# Patient Record
Sex: Female | Born: 1990
Health system: Southern US, Community
[De-identification: ages and names within clinical notes are randomized; demographics above are authoritative.]

## PROBLEM LIST (undated history)

## (undated) DIAGNOSIS — R87612 Low grade squamous intraepithelial lesion on cytologic smear of cervix (LGSIL): Secondary | ICD-10-CM

## (undated) HISTORY — DX: Low grade squamous intraepithelial lesion on cytologic smear of cervix (LGSIL): R87.612

---

## 2016-09-10 ENCOUNTER — Ambulatory Visit (INDEPENDENT_AMBULATORY_CARE_PROVIDER_SITE_OTHER): Payer: No Typology Code available for payment source | Admitting: Family Medicine

## 2016-09-10 VITALS — BP 120/88 | HR 81 | Temp 98.3°F | Resp 16 | Ht 68.5 in | Wt 135.8 lb

## 2016-09-10 DIAGNOSIS — R51 Headache: Secondary | ICD-10-CM

## 2016-09-10 DIAGNOSIS — H5712 Ocular pain, left eye: Secondary | ICD-10-CM | POA: Diagnosis not present

## 2016-09-10 DIAGNOSIS — R519 Headache, unspecified: Secondary | ICD-10-CM

## 2016-09-10 LAB — CBC
HCT: 41.8 % (ref 35.0–45.0)
Hemoglobin: 14 g/dL (ref 11.7–15.5)
MCH: 31.1 pg (ref 27.0–33.0)
MCHC: 33.5 g/dL (ref 32.0–36.0)
MCV: 92.9 fL (ref 80.0–100.0)
MPV: 9.3 fL (ref 7.5–12.5)
PLATELETS: 291 10*3/uL (ref 140–400)
RBC: 4.5 MIL/uL (ref 3.80–5.10)
RDW: 12.8 % (ref 11.0–15.0)
WBC: 4.9 10*3/uL (ref 3.8–10.8)

## 2016-09-10 LAB — THYROID PANEL WITH TSH
FREE THYROXINE INDEX: 2.8 (ref 1.4–3.8)
T3 UPTAKE: 29 % (ref 22–35)
T4, Total: 9.6 ug/dL (ref 4.5–12.0)
TSH: 1.43 mIU/L

## 2016-09-10 LAB — COMPREHENSIVE METABOLIC PANEL
ALBUMIN: 4.3 g/dL (ref 3.6–5.1)
ALT: 22 U/L (ref 6–29)
AST: 24 U/L (ref 10–30)
Alkaline Phosphatase: 47 U/L (ref 33–115)
BUN: 12 mg/dL (ref 7–25)
CALCIUM: 9.6 mg/dL (ref 8.6–10.2)
CHLORIDE: 103 mmol/L (ref 98–110)
CO2: 26 mmol/L (ref 20–31)
CREATININE: 0.79 mg/dL (ref 0.50–1.10)
Glucose, Bld: 86 mg/dL (ref 65–99)
Potassium: 4.4 mmol/L (ref 3.5–5.3)
SODIUM: 139 mmol/L (ref 135–146)
Total Bilirubin: 0.5 mg/dL (ref 0.2–1.2)
Total Protein: 7.1 g/dL (ref 6.1–8.1)

## 2016-09-10 LAB — FERRITIN: FERRITIN: 13 ng/mL (ref 10–154)

## 2016-09-10 NOTE — Progress Notes (Signed)
By signing my name below I, Shelah Lewandowsky, attest that this documentation has been prepared under the direction and in the presence of Norberto Sorenson, MD. Electonically Signed. Shelah Lewandowsky, Scribe 09/10/2016 at 12:04 PM  Subjective:    Patient ID: Jacqueline Hood, female    DOB: 1991/11/01, 25 y.o.   MRN: [id number]  Chief Complaint  Patient presents with  . Headache    behind Lt Eye off & on for 1 year    HPI Jacqueline Hood is a 25 y.o. female who presents to the [facility] complaining of HAs. Pt has had intermittent HAs behind left eye. Pt denies any eye waterying or runny nose during episodes. Pt denies photophobia during HAs. Pt states she has always had some trouble with bright lights but has never had any pain or photophobia.   2 weeks ago pt reports sudden onset of pressure under left eye, felt pre-syncopal during episode, resolved spontaneously. Had had second episode of similar pain. Pt also had HA during that time. Pt "did not feel right for 2 days afterwards". Pt thinks she may have had left hand numbness during that time and is unsure if it was real or imagined because she was anxious about her symptoms.   Pt reports that her balance has been fine. Denies any vertigo, numbness, changes in there hearing, urinary symptoms, abd pain, changes in her BMs. Pt also c/o swelling along her upper anterior neck. Pt has always seen "floaters" in her eye.   Takes spironolactone for acne, noticed she was having more HAs when she was taking 75mg  spironolactone, so she went back down to 50mg  spironolactone.   Pt's mother's sister has migraines. No other family history of HAs.   Pt reports first visit with opthalmologist 2 years ago. Pt seen by her eye doctor within the past week.   Pt denies any sinus issues or allergies.  Pt has distant history of anemia 10 years ago and has never had it rechecked.   Pt reports having normal menstrual periods.   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  There are no active problems to display for this patient.   No current outpatient prescriptions on file prior to visit.   No current facility-administered medications on file prior to visit.     Allergies  Allergen Reactions  . Levaquin [Levofloxacin In D5w] Shortness Of Breath    Depression screen [facility] 2/9 09/10/2016  Decreased Interest 0  Down, Depressed, Hopeless 0  PHQ - 2 Score 0       Review of Systems  Constitutional: Negative for fatigue and fever.  HENT: Negative for congestion, ear pain, hearing loss, rhinorrhea and sinus pressure.   Eyes: Negative for pain and visual disturbance.  Respiratory: Negative.   Cardiovascular: Negative.   Gastrointestinal: Negative.   Genitourinary: Negative.   Musculoskeletal: Negative.   Skin: Negative.   Neurological: Positive for headaches (intermittent). Negative for numbness.  Psychiatric/Behavioral: Negative.        Objective:   Physical Exam  Constitutional: She is oriented to person, place, and time. She appears well-developed and well-nourished. No distress.  HENT:  Head: Normocephalic and atraumatic.  Eyes: Conjunctivae and EOM are normal. Pupils are equal, round, and reactive to light.  Normal fundoscopic exam bilat.  Neck: Neck supple.  Cardiovascular: Normal rate, regular rhythm, S1 normal, S2 normal and normal heart sounds.  Exam reveals no gallop and no friction rub.   No murmur heard. Pulmonary/Chest: Effort normal and breath sounds normal. No accessory muscle usage. She has no decreased breath sounds. She has no wheezes. She has no rhonchi. She has no rales.  Musculoskeletal: Normal range of motion.  Neurological: She is alert and oriented to person, place, and time. She has  normal strength. No cranial nerve deficit or sensory deficit. She displays a negative Romberg sign.  Normal rapid alternating movement. Normal finger to nose. Normal heel to shin test.   Skin: Skin is warm and dry.  Psychiatric: She has a normal mood and affect. Her behavior is normal.  Nursing note and vitals reviewed. 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 Pt may need a referral for MRI because pt would like to go to where ever can provide the cheapest brain MRI. 1. Left-sided headache   2. Orbital pain, left   Very atypical but I am concerned as onset 1 yr ago now with worsening frequency and intensity. 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 I think we do need to proceed with imaging and neuro referral.  Orders Placed This Encounter  Procedures  . MR Brain Wo Contrast    Pt has multiple insurances and will bring copy of both but thinks that it might be cost-efficient for her to have the MRI done through the [facility] system where [place] out [place] - please call pt to check where she wants it and ensure we have the correct insurance filed prior to scheduling. Thanks.    Standing Status:   Future    Standing Expiration Date:   11/10/2017    Order Specific Question:   Reason for Exam (SYMPTOM  OR DIAGNOSIS REQUIRED)    Answer:   worsening left orbital headaches, new x 7175yr radiating to occiput, thunderclap type, enlarged  left optic disk    Order Specific Question:   Preferred imaging location?    Answer:   External    Order Specific Question:   What is the patient's sedation requirement?    Answer:   No Sedation    Order Specific Question:   Does the patient have a pacemaker or implanted devices?    Answer:   No  . CBC  . Sedimentation Rate  . C-reactive protein  . Comprehensive metabolic panel  . Thyroid Panel With TSH  . Ferritin  . Care order/instruction:    AVS and GO    Scheduling Instructions:     AVS and GO after labs drawn    Meds ordered this encounter  Medications  . spironolactone (ALDACTONE)  50 MG tablet    Sig: Take 50 mg by mouth daily.    I personally performed the services described in this documentation, which was scribed in my presence. The recorded information has been reviewed and considered, and addended by me as needed.   Norberto Sorenson, M.D.  [facility] [address] [place], [place] [address] [phone] phone [phone] fax  09/12/16 2:55 PM

## 2016-09-10 NOTE — Patient Instructions (Signed)
     IF you received an x-ray today, you will receive an invoice from [facility]. Please contact [facility] at [phone] with questions or concerns regarding your invoice.   IF you received labwork today, you will receive an invoice from [facility]. Please contact [facility] at [phone] with questions or concerns regarding your invoice.   Our billing staff will not be able to assist you with questions regarding bills from these companies.  You will be contacted with the lab results as soon as they are available. The fastest way to get your results is to activate your My Chart account. Instructions are located on the last page of this paperwork. If you have not heard from us regarding the results in 2 weeks, please contact this office.      

## 2016-09-11 LAB — SEDIMENTATION RATE: SED RATE: 1 mm/h (ref 0–20)

## 2016-09-12 LAB — C-REACTIVE PROTEIN: CRP: 2.7 mg/L (ref <8.0)

## 2016-09-21 ENCOUNTER — Telehealth: Payer: Self-pay | Admitting: *Deleted

## 2016-09-21 ENCOUNTER — Encounter: Payer: Self-pay | Admitting: Family Medicine

## 2016-09-21 NOTE — Telephone Encounter (Signed)
Patient would like to know lab results from 09/10/16.  [place]  You can just leave vm. [phone]

## 2016-09-21 NOTE — Telephone Encounter (Signed)
Everything looks great. Iron is on the low end of normal so try to increase iron in diet or start an iron supplement for several months.  Letter sent with details on iron.

## 2016-09-23 NOTE — Telephone Encounter (Signed)
Patient mailbox is full could not leave message

## 2016-09-27 NOTE — Telephone Encounter (Signed)
Patient mailbox is full could not leave message

## 2016-10-03 NOTE — Telephone Encounter (Signed)
Letter mailed to patient with lab results also included a pdf of iron enriched foods and daily amount per age

## 2016-10-03 NOTE — Telephone Encounter (Signed)
Unable to leave message due to mailbox being full.

## 2016-12-07 MED FILL — BENZONATATE 100 MG CAPSULE: 100 | 5 days supply | Qty: 30 | Fill #0

## 2016-12-07 MED FILL — METHYLPREDNISOLONE 4 MG TAB: 4 | 6 days supply | Qty: 21 | Fill #0

## 2017-10-20 ENCOUNTER — Other Ambulatory Visit: Payer: Self-pay | Admitting: Occupational Medicine

## 2017-10-20 ENCOUNTER — Ambulatory Visit: Payer: Self-pay

## 2017-10-20 DIAGNOSIS — M545 Low back pain: Secondary | ICD-10-CM

## 2018-01-17 ENCOUNTER — Encounter: Payer: Self-pay | Admitting: Nurse Practitioner

## 2018-01-17 ENCOUNTER — Ambulatory Visit: Payer: Self-pay | Admitting: Nurse Practitioner

## 2018-01-17 VITALS — BP 115/80 | HR 105 | Temp 98.6°F | Resp 16 | Wt 132.8 lb

## 2018-01-17 DIAGNOSIS — J029 Acute pharyngitis, unspecified: Secondary | ICD-10-CM

## 2018-01-17 DIAGNOSIS — J020 Streptococcal pharyngitis: Secondary | ICD-10-CM

## 2018-01-17 LAB — POCT RAPID STREP A (OFFICE): Rapid Strep A Screen: POSITIVE — AB

## 2018-01-17 MED ORDER — AMOXICILLIN-POT CLAVULANATE 875-125 MG PO TABS: 1.0000 | ORAL_TABLET | Freq: Two times a day (BID) | ORAL | 0 refills | Status: DC

## 2018-01-17 MED ORDER — FLUCONAZOLE 150 MG PO TABS: 150.0000 mg | ORAL_TABLET | Freq: Once | ORAL | 0 refills | Status: AC

## 2018-01-17 NOTE — Progress Notes (Signed)
   Subjective:    Patient ID: Selena BattenCharlotte Moravek, female    DOB: 1991-11-30, 27 y.o.   MRN: [id number]  HPI Patient comes in today c/o sore throat that started yesterday. She was diagnosed with strep at the end of last month and took 10 day course of keflex. Got better then sore throat started again yesterday. Low grade fever- taking motrin and tylenol.    Review of Systems  Constitutional: Positive for fever.  HENT: Positive for sore throat and trouble swallowing.   Respiratory: Negative.   Cardiovascular: Negative.   Gastrointestinal: Negative.   Genitourinary: Negative.   Neurological: Positive for headaches.  Psychiatric/Behavioral: Negative.   All other systems reviewed and are negative.      Objective:   Physical Exam  Constitutional: She is oriented to person, place, and time. She appears well-developed and well-nourished. She appears distressed (mild).  HENT:  Right Ear: Hearing, tympanic membrane, external ear and ear canal normal.  Left Ear: Hearing, tympanic membrane, external ear and ear canal normal.  Nose: Nose normal. Right sinus exhibits no maxillary sinus tenderness and no frontal sinus tenderness. Left sinus exhibits no maxillary sinus tenderness and no frontal sinus tenderness.  Mouth/Throat: Uvula is midline. Posterior oropharyngeal edema (2+ left  tonsil) and posterior oropharyngeal erythema present.  Neck: Normal range of motion.  Cardiovascular: Normal rate and regular rhythm.  Pulmonary/Chest: Effort normal and breath sounds normal.  Lymphadenopathy:    She has cervical adenopathy (left anterior cervical).  Neurological: She is alert and oriented to person, place, and time.  Skin: Skin is warm.  Psychiatric: She has a normal mood and affect. Her behavior is normal. Judgment and thought content normal.    BP 115/80 (BP Location: Right Arm, Patient Position: Sitting, Cuff Size: Normal)   Pulse (!) 105   Temp 98.6 F (37 C) (Oral)   Resp 16   Wt 132 lb  12.8 oz (60.2 kg)   SpO2 98%   BMI 19.90 kg/m   Strep positive       Assessment & Plan:   1. Sore throat   2. Strep pharyngitis    Meds ordered this encounter  Medications  . amoxicillin-clavulanate (AUGMENTIN) 875-125 MG tablet    Sig: Take 1 tablet by mouth 2 (two) times daily.    Dispense:  20 tablet    Refill:  0    Order Specific Question:   Supervising Provider    Answer:   Stacie GlazeJENKINS, JOHN E [[id number]]  . fluconazole (DIFLUCAN) 150 MG tablet    Sig: Take 1 tablet (150 mg total) by mouth once for 1 dose.    Dispense:  1 tablet    Refill:  0    Order Specific Question:   Supervising Provider    Answer:   Stacie GlazeJENKINS, JOHN E [phone][[id number]]   Force fluids Motrin or tylenol OTC OTC decongestant Throat lozenges if help New toothbrush in 3 days  Mary-Margaret Daphine DeutscherMartin, FNP

## 2018-01-17 NOTE — Patient Instructions (Signed)

## 2018-05-02 ENCOUNTER — Other Ambulatory Visit: Payer: Self-pay

## 2018-05-02 ENCOUNTER — Ambulatory Visit: Payer: BLUE CROSS/BLUE SHIELD | Admitting: Physician Assistant

## 2018-05-02 ENCOUNTER — Encounter: Payer: Self-pay | Admitting: Physician Assistant

## 2018-05-02 VITALS — BP 110/72 | HR 88 | Temp 98.1°F | Ht 68.5 in | Wt 135.2 lb

## 2018-05-02 DIAGNOSIS — L709 Acne, unspecified: Secondary | ICD-10-CM

## 2018-05-02 DIAGNOSIS — B360 Pityriasis versicolor: Secondary | ICD-10-CM | POA: Diagnosis not present

## 2018-05-02 MED ORDER — SPIRONOLACTONE 50 MG PO TABS: 50.0000 mg | ORAL_TABLET | Freq: Once | ORAL | 3 refills | Status: DC

## 2018-05-02 MED ORDER — SPIRONOLACTONE 50 MG PO TABS: ORAL_TABLET | ORAL | 3 refills | Status: DC

## 2018-05-02 MED ORDER — FLUCONAZOLE 150 MG PO TABS: 300.0000 mg | ORAL_TABLET | Freq: Once | ORAL | 0 refills | Status: AC

## 2018-05-02 MED ORDER — ADAPALENE 0.1 % EX CREA: TOPICAL_CREAM | Freq: Every day | CUTANEOUS | 11 refills | Status: DC

## 2018-05-02 NOTE — Patient Instructions (Addendum)
Purchase some ketoconazole shampoo and use daily for three days (allow this to sit on the skin for 5 minutes). If the rash fails to abate then take diflucan as prescribed.     IF you received an x-ray today, you will receive an invoice from [facility]. Please contact [facility] at [phone] with questions or concerns regarding your invoice.   IF you received labwork today, you will receive an invoice from [facility]. Please contact [facility] at [phone] with questions or concerns regarding your invoice.   Our billing staff will not be able to assist you with questions regarding bills from these companies.  You will be contacted with the lab results as soon as they are available. The fastest way to get your results is to activate your My Chart account. Instructions are located on the last page of this paperwork. If you have not heard from [place] regarding the results in 2 weeks, please contact this office.

## 2018-05-02 NOTE — Progress Notes (Signed)
05/02/2018 9:14 AM   DOB: 12/01/1991 / MRN: [id number]  SUBJECTIVE:  Jacqueline Hood is a 27 y.o. female presenting for refills of spironolactone.  Has taken this for control of acne in the past. She has a history of chronic HA and Dr. Clelia Croft had ordered an MRI of brain roughly 2 years ago.  [name] tells me that she simply started drinking more water and stopped working 3rd shift and the chronic HA abated.  She is no longer having HA and feels completely well today. She would like a year of spironolactone if possible.   Tells me that she has a discoloring and sometimes itchy rash on her chest and back during the summer months.  Would like to have some options for this in the event that this comes back.   She is allergic to levaquin [levofloxacin in d5w].   She  has no past medical history on file.    She  reports that she has never smoked. She has never used smokeless tobacco. She reports that she does not use drugs. She  reports that she does not engage in sexual activity. The patient  has no past surgical history on file.  Her family history includes Heart disease in her maternal grandfather and paternal grandfather; Hyperlipidemia in her paternal grandfather; Hypertension in her paternal grandfather; Stroke in her maternal grandmother.  Review of Systems  Constitutional: Negative for chills, diaphoresis and fever.  Eyes: Negative.   Respiratory: Negative for cough, hemoptysis, sputum production, shortness of breath and wheezing.   Cardiovascular: Negative for chest pain, orthopnea and leg swelling.  Gastrointestinal: Negative for abdominal pain, blood in stool, constipation, diarrhea, heartburn, melena, nausea and vomiting.  Genitourinary: Negative for dysuria, flank pain, frequency, hematuria and urgency.  Skin: Negative for rash.  Neurological: Negative for dizziness, sensory change, speech change, focal weakness and headaches.    The problem list and medications were reviewed  and updated by myself where necessary and exist elsewhere in the encounter.   OBJECTIVE:  BP 110/72 (BP Location: Left Arm, Patient Position: Sitting, Cuff Size: Normal)   Pulse 88   Temp 98.1 F (36.7 C) (Oral)   Ht 5' 8.5" (1.74 m)   Wt 135 lb 3.2 oz (61.3 kg)   LMP 04/11/2018   SpO2 100%   BMI 20.26 kg/m   Physical Exam  Constitutional: She is oriented to person, place, and time. She appears well-nourished. No distress.  Eyes: Pupils are equal, round, and reactive to light. EOM are normal.  Cardiovascular: Normal rate, regular rhythm, S1 normal, S2 normal, normal heart sounds and intact distal pulses. Exam reveals no gallop, no friction rub and no decreased pulses.  No murmur heard. Pulmonary/Chest: Effort normal. No stridor. No respiratory distress. She has no wheezes. She has no rales.  Abdominal: She exhibits no distension.  Musculoskeletal: She exhibits no edema.  Neurological: She is alert and oriented to person, place, and time. No cranial nerve deficit. Gait normal.  Skin: Skin is dry. She is not diaphoretic.  Acne vulgaris about the face. Mild.   Psychiatric: She has a normal mood and affect.  Vitals reviewed.   No results found for this or any previous visit (from the past 72 hour(s)).  No results found.  ASSESSMENT AND PLAN:  [name] was seen today for medication refill.  Diagnoses and all orders for this visit:  Acne, unspecified acne type -     Discontinue: spironolactone (ALDACTONE) 50 MG tablet; Take 1-2 tablets (50-100 mg total)  by mouth once for 1 dose. -     Renal Function Panel -     adapalene (DIFFERIN) 0.1 % cream; Apply topically at bedtime. -     spironolactone (ALDACTONE) 50 MG tablet; Take 50-100 mg daily.  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 Repeat in one week if needed    The patient is advised to call or return to clinic if she  does not see an improvement in symptoms, or to seek the care of the closest emergency department if she worsens with the above plan.   Deliah [place], MHS, PA-C [facility] 05/02/2018 9:14 AM

## 2018-05-03 ENCOUNTER — Encounter: Payer: Self-pay | Admitting: Radiology

## 2018-05-03 LAB — RENAL FUNCTION PANEL
ALBUMIN: 4.3 g/dL (ref 3.5–5.5)
BUN/Creatinine Ratio: 13 (ref 9–23)
BUN: 10 mg/dL (ref 6–20)
CHLORIDE: 104 mmol/L (ref 96–106)
CO2: 22 mmol/L (ref 20–29)
CREATININE: 0.8 mg/dL (ref 0.57–1.00)
Calcium: 9.4 mg/dL (ref 8.7–10.2)
GFR calc non Af Amer: 100 mL/min/{1.73_m2} (ref >59)
GFR, EST AFRICAN AMERICAN: 115 mL/min/{1.73_m2} (ref >59)
Glucose: 88 mg/dL (ref 65–99)
Phosphorus: 3.8 mg/dL (ref 2.5–4.5)
Potassium: 4.4 mmol/L (ref 3.5–5.2)
Sodium: 139 mmol/L (ref 134–144)

## 2018-05-15 ENCOUNTER — Other Ambulatory Visit: Payer: Self-pay | Admitting: Physician Assistant

## 2018-05-15 MED ORDER — TRETINOIN 0.05 % EX CREA: TOPICAL_CREAM | Freq: Every day | CUTANEOUS | 3 refills | Status: DC

## 2019-03-05 ENCOUNTER — Encounter: Payer: Self-pay | Admitting: Student

## 2019-06-25 IMAGING — DX DG LUMBAR SPINE COMPLETE 4+V
5 series · 5 of 5 positions shown · non-contrast
Comparison: None.

CLINICAL DATA: Patient injured her back lifting a patient
09/18/2017; persistent Plinio Melisa/Ivana

EXAM:
LUMBAR SPINE - COMPLETE 4+ VIEW

[l-spine ap]
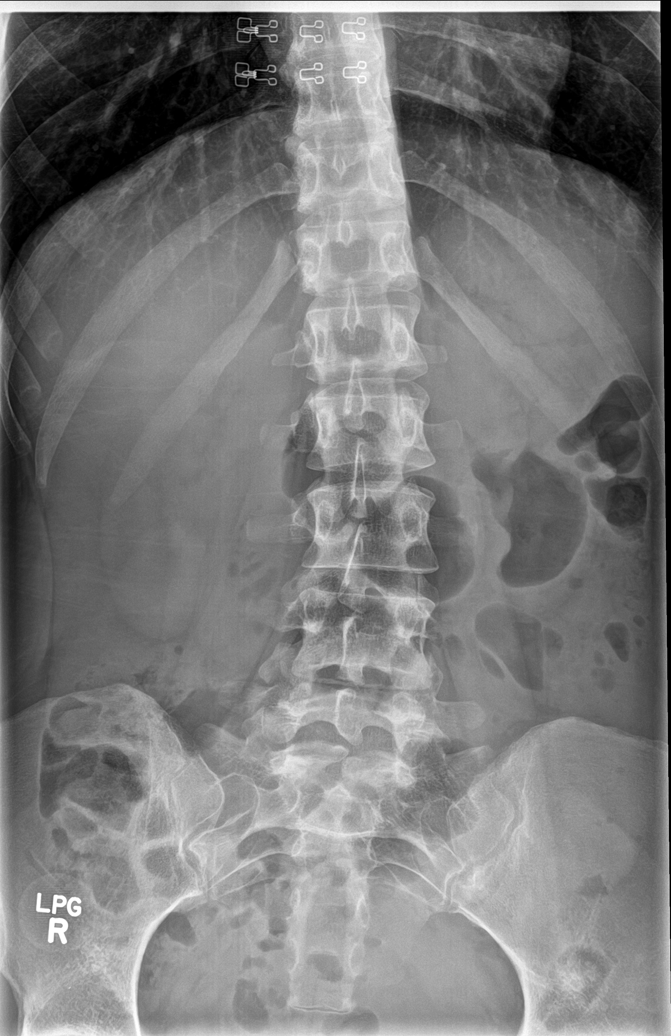

[l-spine obl (1 of 2)]
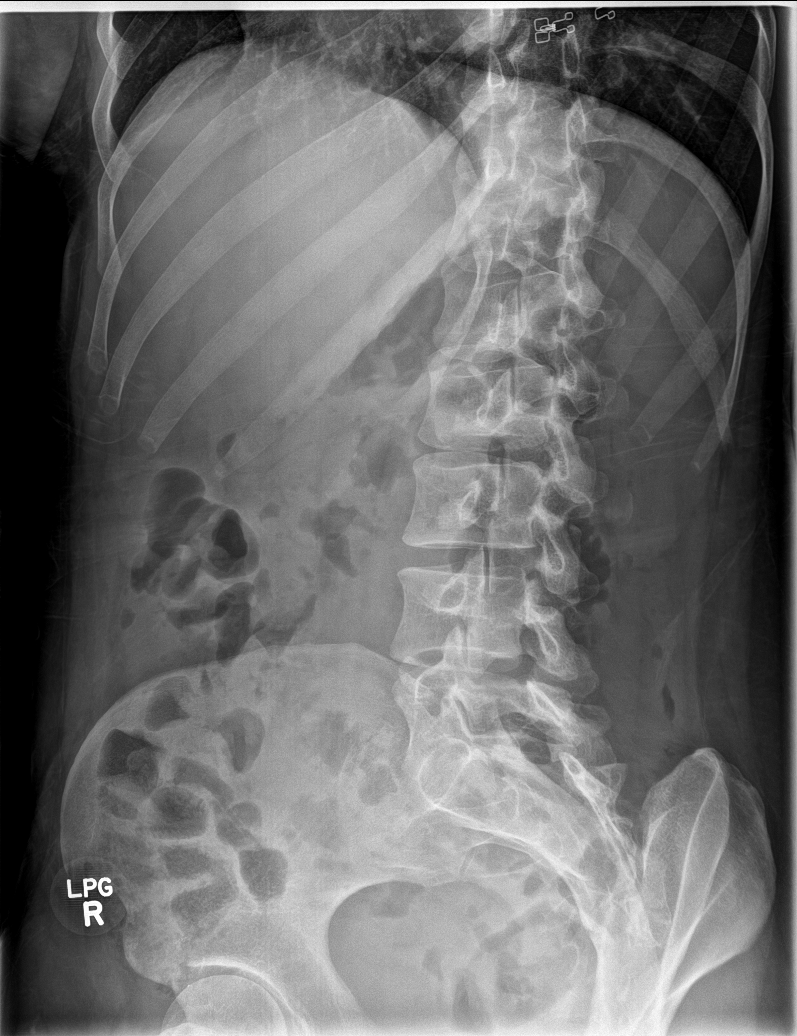

[l-spine obl (2 of 2)]
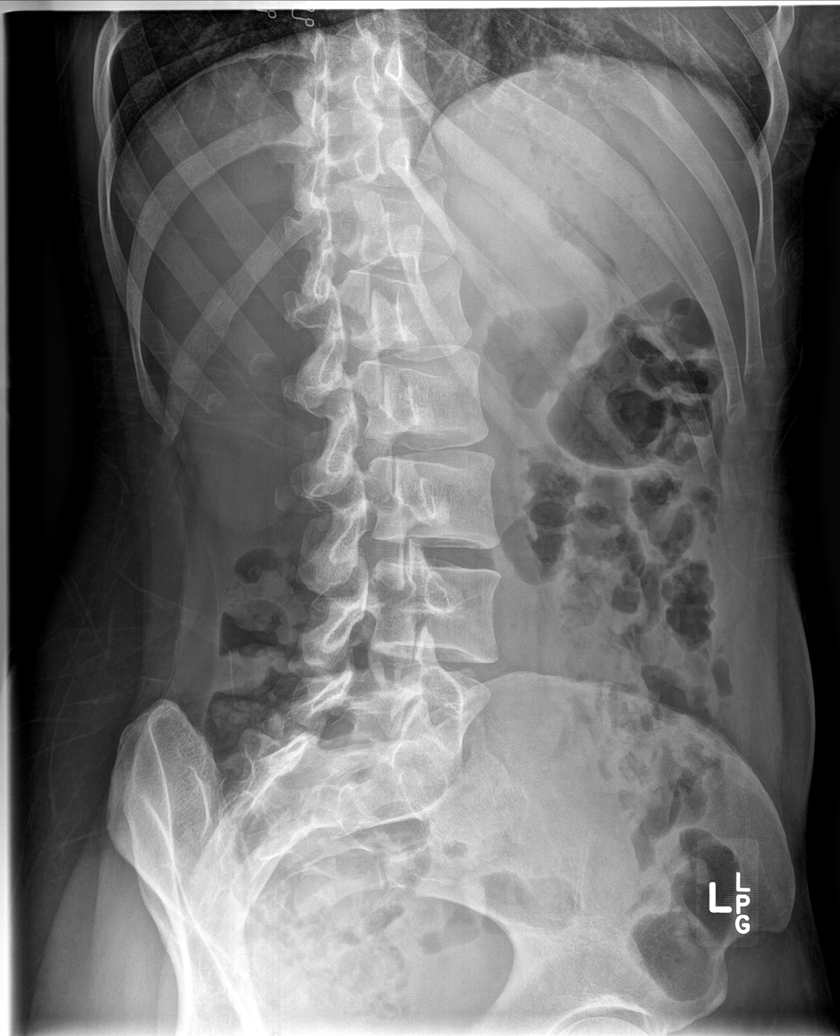

[l-spine lat]
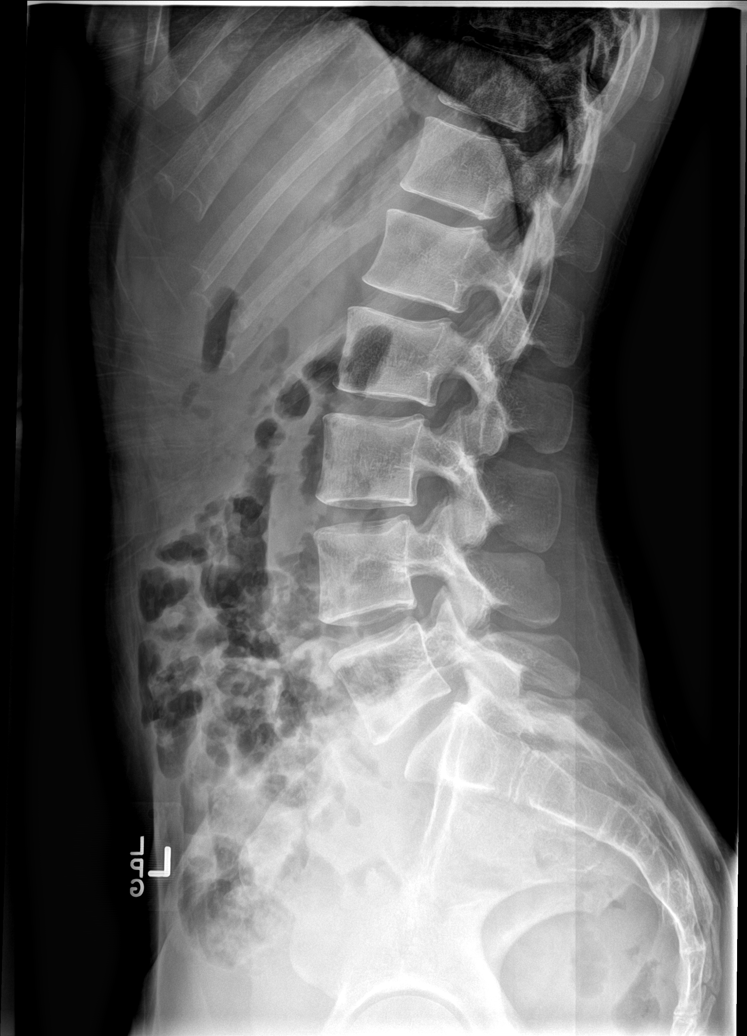

[l-spine spot]
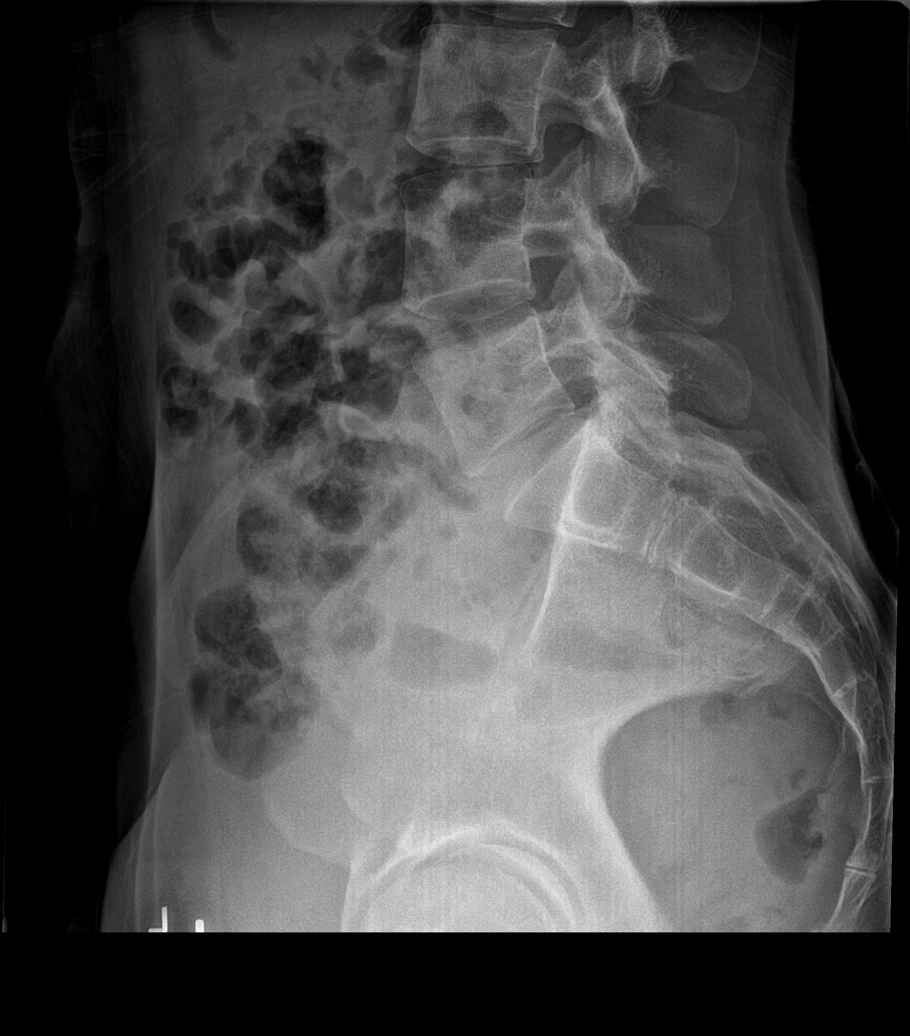

[5 of 5 positions shown; findings below may reference images not displayed]

FINDINGS: There is mild convex left scoliosis. Normal alignment otherwise. No
acute fracture or subluxation. No suspicious lytic or blastic
lesions are identified. Bowel gas pattern is nonobstructive.
IMPRESSION: 1. Mild scoliosis.
2.  No evidence for acute  abnormality.

## 2020-08-20 ENCOUNTER — Telehealth (HOSPITAL_COMMUNITY): Payer: Self-pay | Admitting: Emergency Medicine

## 2020-08-20 NOTE — Telephone Encounter (Signed)
Pt called requesting Mychart access be updated.

## 2024-01-02 ENCOUNTER — Ambulatory Visit: Payer: Self-pay

## 2024-07-09 ENCOUNTER — Encounter: Payer: Self-pay | Admitting: Obstetrics & Gynecology

## 2024-07-09 ENCOUNTER — Ambulatory Visit (INDEPENDENT_AMBULATORY_CARE_PROVIDER_SITE_OTHER): Admitting: Obstetrics & Gynecology

## 2024-07-09 ENCOUNTER — Other Ambulatory Visit (HOSPITAL_COMMUNITY)
Admission: RE | Admit: 2024-07-09 | Discharge: 2024-07-09 | Disposition: A | Discharge status: Home / Self Care | Source: Ambulatory Visit | Attending: Obstetrics & Gynecology | Admitting: Obstetrics & Gynecology

## 2024-07-09 VITALS — BP 118/82 | HR 86 | Ht 69.0 in | Wt 159.0 lb

## 2024-07-09 DIAGNOSIS — Z01419 Encounter for gynecological examination (general) (routine) without abnormal findings: Secondary | ICD-10-CM

## 2024-07-09 NOTE — Progress Notes (Signed)
 WELL-WOMAN EXAMINATION Patient name: Jacqueline Hood MRN [id number]  Date of birth: May 23, 1991 Chief Complaint:   Gynecologic Exam  History of Present Illness:   Jacqueline Hood is a 33 y.o. G0P0000  female being seen today for a routine well-woman exam.    Stopped OCPs about 2 yrs ago, menses are regular each month, lasting for about 3 days.  Denies HMB or dysmenorrhea.  She does note occasional pain either on her left or right side, the pain has not been enough for her to be concerned as it resolves on its own.  Reports no acute GYN concerns    Patient's last menstrual period was 06/21/2024.  The current method of family planning is condoms, working on vasectomy as a possibility.    Last pap due today.   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 Last colonoscopy: NA     07/09/2024    3:26 PM 05/02/2018    8:38 AM 09/10/2016   11:36 AM  Depression screen PHQ 2/9  Decreased Interest 0 0 0  Down, Depressed, Hopeless 0 0 0  PHQ - 2 Score 0 0 0  Altered sleeping 0    Tired, decreased energy 0    Change in appetite 0    Feeling bad or failure about yourself  0    Trouble concentrating 0    Moving slowly or fidgety/restless 0    Suicidal thoughts 0    PHQ-9 Score 0        Review of Systems:   Pertinent items are noted in HPI Denies any headaches, blurred vision, fatigue, shortness of breath, chest pain, abdominal pain, bowel movements, urination, or intercourse unless otherwise stated above.  Pertinent History Reviewed:  Reviewed past medical,surgical, social and family history.  Reviewed problem list, medications and allergies. Physical Assessment:   Vitals:   07/09/24 1529  BP: 118/82  Pulse: 86  Weight: 159 lb (72.1 kg)  Height: 5' 9 (1.753 m)  Body mass index is 23.48 kg/m.        Physical Examination:   General appearance - well appearing, and in no distress  Mental status - alert, oriented to person, place, and  time  Psych:  She has a normal mood and affect  Skin - warm and dry, normal color, no suspicious lesions noted  Chest - effort normal, all lung fields clear to auscultation bilaterally  Heart - normal rate and regular rhythm  Neck:  midline trachea, no thyromegaly or nodules  Breasts - breasts appear normal, no suspicious masses, no skin or nipple changes or  axillary nodes  Abdomen - soft, nontender, nondistended, no masses or organomegaly  Pelvic - VULVA: normal appearing vulva with no masses, tenderness or lesions  VAGINA: normal appearing vagina with normal color and discharge, no lesions  CERVIX: normal appearing cervix without discharge or lesions, no CMT  Thin prep pap is done with HR HPV cotesting  UTERUS: uterus is felt to be normal size, shape, consistency and nontender   ADNEXA: No adnexal masses or tenderness noted.  Extremities:  No swelling or varicosities noted  Chaperone: Alan Fischer     Assessment & Plan:  1) Well-Woman Exam -pap collected, further management pending results and discussed ASCCP guidelines -STI screening up to date  2) Contraceptive management -doing well with condoms - Briefly discussed long term non-hormonal options from ParaGard to bilateral salpingectomy  No orders of the defined types were placed in this encounter.  Meds: No orders of the defined types were placed in this encounter.   Follow-up: Return in about 1 year (around 07/09/2025) for Annual.   Miri Jose, DO Attending Obstetrician & Gynecologist, [facility], [facility]

## 2024-07-14 ENCOUNTER — Ambulatory Visit: Payer: Self-pay | Admitting: Obstetrics & Gynecology

## 2024-07-14 LAB — CYTOLOGY - PAP
Comment: NEGATIVE
Diagnosis: NEGATIVE
High risk HPV: NEGATIVE
